# Patient Record
Sex: Male | Born: 1995 | Race: White | Hispanic: No | Marital: Single
Health system: Southern US, Community
[De-identification: ages and names within clinical notes are randomized; demographics above are authoritative.]

---
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
## 2015-04-25 DIAGNOSIS — F331 Major depressive disorder, recurrent, moderate: Secondary | ICD-10-CM | POA: Diagnosis not present

## 2015-05-22 DIAGNOSIS — F3341 Major depressive disorder, recurrent, in partial remission: Secondary | ICD-10-CM | POA: Diagnosis not present

## 2015-07-22 DIAGNOSIS — Z91018 Allergy to other foods: Secondary | ICD-10-CM | POA: Diagnosis not present

## 2015-07-22 DIAGNOSIS — Z0000 Encounter for general adult medical examination without abnormal findings: Secondary | ICD-10-CM | POA: Diagnosis not present

## 2015-07-22 DIAGNOSIS — F325 Major depressive disorder, single episode, in full remission: Secondary | ICD-10-CM | POA: Diagnosis not present

## 2015-07-22 DIAGNOSIS — J301 Allergic rhinitis due to pollen: Secondary | ICD-10-CM | POA: Diagnosis not present

## 2015-09-02 DIAGNOSIS — F3341 Major depressive disorder, recurrent, in partial remission: Secondary | ICD-10-CM | POA: Diagnosis not present

## 2015-10-03 DIAGNOSIS — F419 Anxiety disorder, unspecified: Secondary | ICD-10-CM | POA: Diagnosis not present

## 2015-10-03 DIAGNOSIS — F3340 Major depressive disorder, recurrent, in remission, unspecified: Secondary | ICD-10-CM | POA: Diagnosis not present

## 2015-10-14 DIAGNOSIS — F3341 Major depressive disorder, recurrent, in partial remission: Secondary | ICD-10-CM | POA: Diagnosis not present

## 2015-10-14 DIAGNOSIS — F419 Anxiety disorder, unspecified: Secondary | ICD-10-CM | POA: Diagnosis not present

## 2015-11-18 DIAGNOSIS — F332 Major depressive disorder, recurrent severe without psychotic features: Secondary | ICD-10-CM | POA: Diagnosis not present

## 2015-11-25 DIAGNOSIS — F332 Major depressive disorder, recurrent severe without psychotic features: Secondary | ICD-10-CM | POA: Diagnosis not present

## 2015-12-02 DIAGNOSIS — F332 Major depressive disorder, recurrent severe without psychotic features: Secondary | ICD-10-CM | POA: Diagnosis not present

## 2015-12-09 DIAGNOSIS — F332 Major depressive disorder, recurrent severe without psychotic features: Secondary | ICD-10-CM | POA: Diagnosis not present

## 2015-12-18 DIAGNOSIS — F332 Major depressive disorder, recurrent severe without psychotic features: Secondary | ICD-10-CM | POA: Diagnosis not present

## 2015-12-23 DIAGNOSIS — F332 Major depressive disorder, recurrent severe without psychotic features: Secondary | ICD-10-CM | POA: Diagnosis not present

## 2016-01-20 DIAGNOSIS — F332 Major depressive disorder, recurrent severe without psychotic features: Secondary | ICD-10-CM | POA: Diagnosis not present

## 2016-02-03 DIAGNOSIS — F332 Major depressive disorder, recurrent severe without psychotic features: Secondary | ICD-10-CM | POA: Diagnosis not present

## 2016-02-10 DIAGNOSIS — Z299 Encounter for prophylactic measures, unspecified: Secondary | ICD-10-CM | POA: Diagnosis not present

## 2016-02-10 DIAGNOSIS — Z23 Encounter for immunization: Secondary | ICD-10-CM | POA: Diagnosis not present

## 2016-02-10 DIAGNOSIS — Z114 Encounter for screening for human immunodeficiency virus [HIV]: Secondary | ICD-10-CM | POA: Diagnosis not present

## 2016-02-10 DIAGNOSIS — Z113 Encounter for screening for infections with a predominantly sexual mode of transmission: Secondary | ICD-10-CM | POA: Diagnosis not present

## 2016-02-10 DIAGNOSIS — F332 Major depressive disorder, recurrent severe without psychotic features: Secondary | ICD-10-CM | POA: Diagnosis not present

## 2016-02-17 DIAGNOSIS — F332 Major depressive disorder, recurrent severe without psychotic features: Secondary | ICD-10-CM | POA: Diagnosis not present

## 2016-02-24 DIAGNOSIS — F330 Major depressive disorder, recurrent, mild: Secondary | ICD-10-CM | POA: Diagnosis not present

## 2016-03-02 DIAGNOSIS — F330 Major depressive disorder, recurrent, mild: Secondary | ICD-10-CM | POA: Diagnosis not present

## 2016-03-09 DIAGNOSIS — F330 Major depressive disorder, recurrent, mild: Secondary | ICD-10-CM | POA: Diagnosis not present

## 2016-03-30 DIAGNOSIS — F330 Major depressive disorder, recurrent, mild: Secondary | ICD-10-CM | POA: Diagnosis not present

## 2016-04-13 DIAGNOSIS — F330 Major depressive disorder, recurrent, mild: Secondary | ICD-10-CM | POA: Diagnosis not present

## 2016-05-11 DIAGNOSIS — F330 Major depressive disorder, recurrent, mild: Secondary | ICD-10-CM | POA: Diagnosis not present

## 2016-09-29 DIAGNOSIS — B079 Viral wart, unspecified: Secondary | ICD-10-CM | POA: Diagnosis not present

## 2016-10-15 DIAGNOSIS — Z23 Encounter for immunization: Secondary | ICD-10-CM | POA: Diagnosis not present

## 2017-05-24 DIAGNOSIS — F3342 Major depressive disorder, recurrent, in full remission: Secondary | ICD-10-CM | POA: Diagnosis not present

## 2017-05-24 DIAGNOSIS — F419 Anxiety disorder, unspecified: Secondary | ICD-10-CM | POA: Diagnosis not present

## 2017-08-08 DIAGNOSIS — M25571 Pain in right ankle and joints of right foot: Secondary | ICD-10-CM | POA: Diagnosis not present

## 2017-08-08 DIAGNOSIS — M79671 Pain in right foot: Secondary | ICD-10-CM | POA: Diagnosis not present

## 2017-08-08 DIAGNOSIS — S93621A Sprain of tarsometatarsal ligament of right foot, initial encounter: Secondary | ICD-10-CM | POA: Diagnosis not present

## 2017-08-08 DIAGNOSIS — S93621D Sprain of tarsometatarsal ligament of right foot, subsequent encounter: Secondary | ICD-10-CM | POA: Diagnosis not present

## 2017-11-02 DIAGNOSIS — F332 Major depressive disorder, recurrent severe without psychotic features: Secondary | ICD-10-CM | POA: Diagnosis not present

## 2017-11-02 DIAGNOSIS — F4010 Social phobia, unspecified: Secondary | ICD-10-CM | POA: Diagnosis not present

## 2017-12-14 DIAGNOSIS — F4010 Social phobia, unspecified: Secondary | ICD-10-CM | POA: Diagnosis not present

## 2017-12-14 DIAGNOSIS — F332 Major depressive disorder, recurrent severe without psychotic features: Secondary | ICD-10-CM | POA: Diagnosis not present

## 2017-12-28 DIAGNOSIS — H5213 Myopia, bilateral: Secondary | ICD-10-CM | POA: Diagnosis not present

## 2018-08-14 DIAGNOSIS — Z711 Person with feared health complaint in whom no diagnosis is made: Secondary | ICD-10-CM | POA: Diagnosis not present

## 2018-10-19 DIAGNOSIS — F4010 Social phobia, unspecified: Secondary | ICD-10-CM | POA: Diagnosis not present

## 2018-10-19 DIAGNOSIS — F332 Major depressive disorder, recurrent severe without psychotic features: Secondary | ICD-10-CM | POA: Diagnosis not present

## 2018-11-02 DIAGNOSIS — F4010 Social phobia, unspecified: Secondary | ICD-10-CM | POA: Diagnosis not present

## 2018-11-02 DIAGNOSIS — F332 Major depressive disorder, recurrent severe without psychotic features: Secondary | ICD-10-CM | POA: Diagnosis not present

## 2018-11-21 DIAGNOSIS — F332 Major depressive disorder, recurrent severe without psychotic features: Secondary | ICD-10-CM | POA: Diagnosis not present

## 2018-11-21 DIAGNOSIS — F4010 Social phobia, unspecified: Secondary | ICD-10-CM | POA: Diagnosis not present

## 2018-12-20 DIAGNOSIS — F332 Major depressive disorder, recurrent severe without psychotic features: Secondary | ICD-10-CM | POA: Diagnosis not present

## 2018-12-20 DIAGNOSIS — F4010 Social phobia, unspecified: Secondary | ICD-10-CM | POA: Diagnosis not present

## 2019-01-16 DIAGNOSIS — F332 Major depressive disorder, recurrent severe without psychotic features: Secondary | ICD-10-CM | POA: Diagnosis not present

## 2019-01-16 DIAGNOSIS — F4010 Social phobia, unspecified: Secondary | ICD-10-CM | POA: Diagnosis not present

## 2019-02-20 DIAGNOSIS — F332 Major depressive disorder, recurrent severe without psychotic features: Secondary | ICD-10-CM | POA: Diagnosis not present

## 2019-02-20 DIAGNOSIS — F4010 Social phobia, unspecified: Secondary | ICD-10-CM | POA: Diagnosis not present

## 2019-03-20 DIAGNOSIS — F332 Major depressive disorder, recurrent severe without psychotic features: Secondary | ICD-10-CM | POA: Diagnosis not present

## 2019-03-20 DIAGNOSIS — F4010 Social phobia, unspecified: Secondary | ICD-10-CM | POA: Diagnosis not present

## 2019-05-15 DIAGNOSIS — F332 Major depressive disorder, recurrent severe without psychotic features: Secondary | ICD-10-CM | POA: Diagnosis not present

## 2019-05-15 DIAGNOSIS — F4010 Social phobia, unspecified: Secondary | ICD-10-CM | POA: Diagnosis not present

## 2019-08-01 DIAGNOSIS — F4010 Social phobia, unspecified: Secondary | ICD-10-CM | POA: Diagnosis not present

## 2019-08-01 DIAGNOSIS — F332 Major depressive disorder, recurrent severe without psychotic features: Secondary | ICD-10-CM | POA: Diagnosis not present

## 2019-10-22 DIAGNOSIS — F4010 Social phobia, unspecified: Secondary | ICD-10-CM | POA: Diagnosis not present

## 2019-10-22 DIAGNOSIS — F332 Major depressive disorder, recurrent severe without psychotic features: Secondary | ICD-10-CM | POA: Diagnosis not present

## 2019-12-26 DIAGNOSIS — H5213 Myopia, bilateral: Secondary | ICD-10-CM | POA: Diagnosis not present

## 2020-02-28 DIAGNOSIS — F3341 Major depressive disorder, recurrent, in partial remission: Secondary | ICD-10-CM | POA: Diagnosis not present

## 2020-02-28 DIAGNOSIS — F4010 Social phobia, unspecified: Secondary | ICD-10-CM | POA: Diagnosis not present

## 2020-03-27 DIAGNOSIS — F3341 Major depressive disorder, recurrent, in partial remission: Secondary | ICD-10-CM | POA: Diagnosis not present

## 2020-03-27 DIAGNOSIS — F4010 Social phobia, unspecified: Secondary | ICD-10-CM | POA: Diagnosis not present

## 2020-05-08 DIAGNOSIS — F4010 Social phobia, unspecified: Secondary | ICD-10-CM | POA: Diagnosis not present

## 2020-05-08 DIAGNOSIS — F3341 Major depressive disorder, recurrent, in partial remission: Secondary | ICD-10-CM | POA: Diagnosis not present

## 2020-06-02 ENCOUNTER — Telehealth: Payer: Self-pay | Admitting: Pulmonary Disease

## 2020-06-02 ENCOUNTER — Other Ambulatory Visit (HOSPITAL_COMMUNITY): Payer: Self-pay

## 2020-06-02 DIAGNOSIS — J209 Acute bronchitis, unspecified: Secondary | ICD-10-CM | POA: Diagnosis not present

## 2020-06-02 DIAGNOSIS — R059 Cough, unspecified: Secondary | ICD-10-CM | POA: Diagnosis not present

## 2020-06-02 MED ORDER — BREO ELLIPTA 200-25 MCG/INH IN AEPB
1.0000 | INHALATION_SPRAY | Freq: Every day | RESPIRATORY_TRACT | 11 refills | Status: AC
  Filled 2020-06-02: qty 60, 30d supply, fill #0

## 2020-06-02 MED ORDER — BREO ELLIPTA 200-25 MCG/INH IN AEPB: 1.0000 | INHALATION_SPRAY | Freq: Every day | RESPIRATORY_TRACT | 0 refills | Status: AC

## 2020-06-02 NOTE — Telephone Encounter (Addendum)
Samples of Breo 200 placed up front for pick up. Called [phone] and left message letting them know. Prescription sent to preferred pharmacy.  Nothing further needed at this time.

## 2020-06-02 NOTE — Addendum Note (Signed)
Addended by: Benjie Karvonen R on: 06/02/2020 03:47 PM   Modules accepted: Orders

## 2020-06-02 NOTE — Addendum Note (Signed)
Addended by: Benjie Karvonen R on: 06/02/2020 03:38 PM   Modules accepted: Orders

## 2020-06-02 NOTE — Telephone Encounter (Signed)
PCCM:  BREO samples Case discussed. Persistent cough s/p viral URI  Likely reactive airway disease  Trial of ICS/LABA  Josephine Igo, DO [facility] 06/02/2020 2:58 PM

## 2020-06-10 ENCOUNTER — Other Ambulatory Visit (HOSPITAL_COMMUNITY): Payer: Self-pay

## 2020-06-30 ENCOUNTER — Other Ambulatory Visit (HOSPITAL_COMMUNITY): Payer: Self-pay

## 2020-06-30 MED ORDER — BUPROPION HCL ER (XL) 150 MG PO TB24
150.0000 mg | ORAL_TABLET | Freq: Every day | ORAL | 0 refills | Status: AC
  Filled 2020-06-30: qty 90, 90d supply, fill #0

## 2020-06-30 MED ORDER — FLUOXETINE HCL 40 MG PO CAPS
40.0000 mg | ORAL_CAPSULE | Freq: Every morning | ORAL | 0 refills | Status: AC
  Filled 2020-06-30: qty 90, 90d supply, fill #0

## 2020-09-12 ENCOUNTER — Other Ambulatory Visit (HOSPITAL_BASED_OUTPATIENT_CLINIC_OR_DEPARTMENT_OTHER): Payer: Self-pay

## 2020-11-13 ENCOUNTER — Other Ambulatory Visit (HOSPITAL_COMMUNITY): Payer: Self-pay

## 2021-01-27 ENCOUNTER — Other Ambulatory Visit (HOSPITAL_COMMUNITY): Payer: Self-pay
